# Patient Record
Sex: Female | Born: 1945 | Race: Black or African American | Hispanic: No | Smoking: Former smoker
Health system: Southern US, Community
[De-identification: ages and names within clinical notes are randomized; demographics above are authoritative.]

## PROBLEM LIST (undated history)

## (undated) DIAGNOSIS — E559 Vitamin D deficiency, unspecified: Secondary | ICD-10-CM

## (undated) DIAGNOSIS — E785 Hyperlipidemia, unspecified: Secondary | ICD-10-CM

## (undated) DIAGNOSIS — D649 Anemia, unspecified: Secondary | ICD-10-CM

## (undated) DIAGNOSIS — R319 Hematuria, unspecified: Secondary | ICD-10-CM

## (undated) DIAGNOSIS — K635 Polyp of colon: Secondary | ICD-10-CM

## (undated) DIAGNOSIS — I8390 Asymptomatic varicose veins of unspecified lower extremity: Secondary | ICD-10-CM

## (undated) DIAGNOSIS — Z8719 Personal history of other diseases of the digestive system: Secondary | ICD-10-CM

## (undated) DIAGNOSIS — K5909 Other constipation: Secondary | ICD-10-CM

## (undated) HISTORY — DX: Anemia, unspecified: D64.9

## (undated) HISTORY — DX: Asymptomatic varicose veins of unspecified lower extremity: I83.90

## (undated) HISTORY — PX: BUNIONECTOMY: SHX129

## (undated) HISTORY — DX: Personal history of other diseases of the digestive system: Z87.19

## (undated) HISTORY — PX: TONSILLECTOMY: SUR1361

## (undated) HISTORY — DX: Hyperlipidemia, unspecified: E78.5

## (undated) HISTORY — DX: Vitamin D deficiency, unspecified: E55.9

## (undated) HISTORY — DX: Other constipation: K59.09

## (undated) HISTORY — PX: COLONOSCOPY: SHX174

---

## 2006-09-02 ENCOUNTER — Ambulatory Visit: Payer: Self-pay

## 2007-11-26 ENCOUNTER — Ambulatory Visit: Payer: Self-pay | Admitting: Obstetrics and Gynecology

## 2007-12-29 ENCOUNTER — Ambulatory Visit: Payer: Self-pay | Admitting: Unknown Physician Specialty

## 2008-10-14 ENCOUNTER — Ambulatory Visit: Payer: Self-pay | Admitting: Internal Medicine

## 2008-11-29 ENCOUNTER — Ambulatory Visit: Payer: Self-pay | Admitting: Obstetrics and Gynecology

## 2009-09-21 ENCOUNTER — Ambulatory Visit: Payer: Self-pay

## 2009-11-30 ENCOUNTER — Ambulatory Visit: Payer: Self-pay | Admitting: Internal Medicine

## 2010-10-30 ENCOUNTER — Ambulatory Visit: Payer: Self-pay | Admitting: Cardiology

## 2010-12-04 ENCOUNTER — Ambulatory Visit: Payer: Self-pay | Admitting: Internal Medicine

## 2011-12-05 ENCOUNTER — Ambulatory Visit: Payer: Self-pay | Admitting: Internal Medicine

## 2012-03-10 ENCOUNTER — Ambulatory Visit: Payer: Self-pay | Admitting: Internal Medicine

## 2012-12-07 ENCOUNTER — Ambulatory Visit: Payer: Self-pay | Admitting: Internal Medicine

## 2013-03-11 ENCOUNTER — Ambulatory Visit: Payer: Self-pay | Admitting: Unknown Physician Specialty

## 2013-04-30 ENCOUNTER — Ambulatory Visit: Payer: Self-pay | Admitting: Internal Medicine

## 2013-05-21 ENCOUNTER — Ambulatory Visit: Payer: Self-pay | Admitting: Urology

## 2013-09-09 ENCOUNTER — Ambulatory Visit: Payer: Self-pay | Admitting: Internal Medicine

## 2013-11-09 DIAGNOSIS — D649 Anemia, unspecified: Secondary | ICD-10-CM

## 2013-11-09 HISTORY — DX: Anemia, unspecified: D64.9

## 2014-02-03 ENCOUNTER — Ambulatory Visit: Payer: Self-pay | Admitting: Unknown Physician Specialty

## 2014-02-04 ENCOUNTER — Ambulatory Visit: Payer: Self-pay | Admitting: Internal Medicine

## 2014-11-28 ENCOUNTER — Other Ambulatory Visit: Payer: Self-pay | Admitting: Family Medicine

## 2014-11-28 DIAGNOSIS — R3129 Other microscopic hematuria: Secondary | ICD-10-CM

## 2015-03-02 ENCOUNTER — Other Ambulatory Visit: Payer: Self-pay | Admitting: Internal Medicine

## 2015-03-02 DIAGNOSIS — Z1231 Encounter for screening mammogram for malignant neoplasm of breast: Secondary | ICD-10-CM

## 2015-03-03 ENCOUNTER — Other Ambulatory Visit: Payer: Self-pay | Admitting: Internal Medicine

## 2015-03-03 ENCOUNTER — Ambulatory Visit
Admission: RE | Admit: 2015-03-03 | Discharge: 2015-03-03 | Disposition: A | Discharge status: Home / Self Care | Payer: Medicare Other | Source: Ambulatory Visit | Attending: Internal Medicine | Admitting: Internal Medicine

## 2015-03-03 DIAGNOSIS — Z1231 Encounter for screening mammogram for malignant neoplasm of breast: Secondary | ICD-10-CM

## 2015-06-26 ENCOUNTER — Other Ambulatory Visit: Payer: Self-pay | Admitting: Internal Medicine

## 2015-06-26 DIAGNOSIS — K859 Acute pancreatitis without necrosis or infection, unspecified: Secondary | ICD-10-CM

## 2015-07-03 ENCOUNTER — Ambulatory Visit
Admission: RE | Admit: 2015-07-03 | Discharge: 2015-07-03 | Disposition: A | Discharge status: Home / Self Care | Payer: Medicare Other | Source: Ambulatory Visit | Attending: Internal Medicine | Admitting: Internal Medicine

## 2015-07-03 ENCOUNTER — Ambulatory Visit: Payer: Medicare Other

## 2015-07-03 DIAGNOSIS — K7689 Other specified diseases of liver: Secondary | ICD-10-CM | POA: Diagnosis not present

## 2015-07-03 DIAGNOSIS — K859 Acute pancreatitis without necrosis or infection, unspecified: Secondary | ICD-10-CM | POA: Diagnosis not present

## 2015-12-26 ENCOUNTER — Other Ambulatory Visit: Payer: Self-pay

## 2015-12-26 DIAGNOSIS — E785 Hyperlipidemia, unspecified: Secondary | ICD-10-CM

## 2015-12-26 DIAGNOSIS — K5909 Other constipation: Secondary | ICD-10-CM

## 2015-12-26 DIAGNOSIS — E559 Vitamin D deficiency, unspecified: Secondary | ICD-10-CM | POA: Insufficient documentation

## 2015-12-26 DIAGNOSIS — I8390 Asymptomatic varicose veins of unspecified lower extremity: Secondary | ICD-10-CM

## 2015-12-26 DIAGNOSIS — Z8719 Personal history of other diseases of the digestive system: Secondary | ICD-10-CM

## 2015-12-26 HISTORY — DX: Hyperlipidemia, unspecified: E78.5

## 2015-12-26 HISTORY — DX: Vitamin D deficiency, unspecified: E55.9

## 2015-12-26 HISTORY — DX: Asymptomatic varicose veins of unspecified lower extremity: I83.90

## 2015-12-26 HISTORY — DX: Other constipation: K59.09

## 2015-12-26 HISTORY — DX: Personal history of other diseases of the digestive system: Z87.19

## 2015-12-27 ENCOUNTER — Encounter: Payer: Self-pay | Admitting: Gastroenterology

## 2015-12-27 ENCOUNTER — Ambulatory Visit (INDEPENDENT_AMBULATORY_CARE_PROVIDER_SITE_OTHER): Payer: Medicare Other | Admitting: Gastroenterology

## 2015-12-27 VITALS — BP 117/63 | HR 70 | Temp 98.2°F | Ht 67.0 in | Wt 150.0 lb

## 2015-12-27 DIAGNOSIS — K859 Acute pancreatitis without necrosis or infection, unspecified: Secondary | ICD-10-CM | POA: Diagnosis not present

## 2015-12-27 NOTE — Progress Notes (Signed)
Gastroenterology Consultation  Referring Provider:     Marguarite Arbour, MD Primary Care Physician:  Marguarite Arbour, MD Primary Gastroenterologist:  Dr. Servando Snare     Reason for Consultation:     Elevated lipase        HPI:   Jamie Chaney is a 70 y.o. y/o female referred for consultation & management of Elevated lipase by Dr. Marguarite Arbour, MD.  This patient comes in today for a isolated elevated lipase. The patient has been seen in the past by Dr. Earnest Conroy nurse practitioner with a workup that included imaging of the abdomen with no signs of gallstones biliary ductal dilatation or stones. There were several hepatic cysts that were stable and nonobstructive calculi in the kidney. The patient denies any alcohol abuse. She denies any abdominal pain. The patient does report that she has back pain at times. There is no report of any prescription medications although the patient does take multiple herbal medications that she states is for her cholesterol. She denies any unexplained weight loss fevers chills nausea or vomiting. The patient also denies any black stools or bloody stools.  Past Medical History  Diagnosis Date  . 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 Avitaminosis D 12/26/2015  . Chronic anemia 11/09/2013  . HLD (hyperlipidemia) 12/26/2015  . Chronic constipation 12/26/2015  . History of pancreatitis 12/26/2015  . Vitamin D deficiency 12/26/2015    Past Surgical History  Procedure Laterality Date  . Cesarean section      x2  . Colonoscopy      2009, 2014  . Bunionectomy    . Tonsillectomy      Prior to Admission medications   Medication Sig Start Date End Date Taking? Authorizing Provider  aspirin EC 81 MG tablet Take by mouth.   Yes Historical Provider, MD  BIOTIN PO Take by mouth.   Yes Historical Provider, MD  calcium carbonate (OS-CAL - DOSED IN MG OF ELEMENTAL CALCIUM) 1250 (500 Ca) MG tablet Take by mouth.   Yes Historical Provider, MD  CHIA SEED PO Take by mouth.   Yes  Historical Provider, MD  Cholecalciferol (VITAMIN D3) 2000 units capsule Take by mouth.   Yes Historical Provider, MD  Flaxseed, Linseed, (FLAX SEED OIL PO) Take by mouth.   Yes Historical Provider, MD  Multiple Vitamin (MULTI-VITAMINS) TABS Take by mouth.   Yes Historical Provider, MD  Omega-3 Fatty Acids (FISH OIL) 1000 MG CAPS Take by mouth.   Yes Historical Provider, MD  Red Yeast Rice Extract (RED YEAST RICE PO) Take by mouth.   Yes Historical Provider, MD    Family History  Problem Relation Age of Onset  . Lung cancer Mother   . Diabetes Mother   . Hypertension Mother   . Kidney disease Father   . Heart failure Father   . Hypertension Father   . Colon polyps Sister   . Breast cancer Paternal Aunt      Social History  Substance Use Topics  . Smoking status: Former Games developer  . Smokeless tobacco: Never Used  . Alcohol Use: 0.0 oz/week    0 Standard drinks or equivalent per week    Allergies as of 12/27/2015  . (No Known Allergies)    Review of Systems:    All systems reviewed and negative except where noted in HPI.   Physical Exam:  BP 117/63 mmHg  Pulse 70  Temp(Src) 98.2 F (36.8 C) (Oral)  Ht  (1.702 m)  Wt 150 lb (68.04  kg)  BMI 23.49 kg/m2 No LMP recorded. Patient is postmenopausal. Psych:  Alert and cooperative. Normal mood and affect. General:   Alert,  Well-developed, well-nourished, pleasant and cooperative in NAD Head:  Normocephalic and atraumatic. Eyes:  Sclera clear, no icterus.   Conjunctiva pink. Ears:  Normal auditory acuity. Nose:  No deformity, discharge, or lesions. Mouth:  No deformity or lesions,oropharynx pink & moist. Neck:  Supple; no masses or thyromegaly. Lungs:  Respirations even and unlabored.  Clear throughout to auscultation.   No wheezes, crackles, or rhonchi. No acute distress. Heart:  Regular rate and rhythm; no murmurs, clicks, rubs, or gallops. Abdomen:  Normal bowel sounds.  No bruits.  Soft, non-tender and non-distended  without masses, hepatosplenomegaly or hernias noted.  No guarding or rebound tenderness.  Negative Carnett sign.   Rectal:  Deferred.  Msk:  Symmetrical without gross deformities.  Good, equal movement & strength bilaterally. Pulses:  Normal pulses noted. Extremities:  No clubbing or edema.  No cyanosis. Neurologic:  Alert and oriented x3;  grossly normal neurologically. Skin:  Intact without significant lesions or rashes.  No jaundice. Lymph Nodes:  No significant cervical adenopathy. Psych:  Alert and cooperative. Normal mood and affect.  Imaging Studies: No results found.  Assessment and Plan:   Jamie Chaney is a 70 y.o. y/o female who comes in today with abnormal lipase. The patient has had this chronically over the last few years. The patient's workup to date has been negative review the patient will have her amylase and lipase checked today in addition to setting off her IgG4 for possible autoimmune pancreatitis. The patient has been told that if these come back as continued elevated enzymes that she should stay off of all of her herbal medication for 2 weeks and have it repeated at that time. If they come back to normal at is likely something that she is taking over-the-counter. If they remain high then the patient has been told that a endoscopic ultrasound would be recommended. The patient has been explained the plan and agrees with it.   Note: This dictation was prepared with Dragon dictation along with smaller phrase technology. Any transcriptional errors that result from this process are unintentional.

## 2015-12-29 LAB — AMYLASE: Amylase: 177 U/L — ABNORMAL HIGH (ref 31–124)

## 2015-12-29 LAB — IGG 4: IGG 4: 17 mg/dL (ref 2–96)

## 2015-12-29 LAB — LIPASE: Lipase: 32 U/L (ref 0–59)

## 2016-01-02 ENCOUNTER — Telehealth: Payer: Self-pay

## 2016-01-02 NOTE — Telephone Encounter (Signed)
LVM for pt to return my call.

## 2016-01-02 NOTE — Telephone Encounter (Signed)
-----   Message from Midge Minium, MD sent at 01/01/2016 12:27 PM EDT ----- Let the patient know that the tests for autoimmune pancreatitis was negative and her other pancreatic enzyme note his lipase was normal. Since she has is chronic elevation of amylase the patient should be sent off for an EUS.

## 2016-01-02 NOTE — Telephone Encounter (Signed)
Pt notified of lab results and Dr. Annabell Sabal recommendation for her to have an EUS. Pt will think about it and discuss with family and contact me if she decides to move forward.

## 2016-01-03 ENCOUNTER — Other Ambulatory Visit: Payer: Self-pay

## 2016-01-03 ENCOUNTER — Telehealth: Payer: Self-pay

## 2016-01-03 DIAGNOSIS — K859 Acute pancreatitis, unspecified: Secondary | ICD-10-CM

## 2016-01-03 NOTE — Telephone Encounter (Signed)
Patient has questions pertaining to her blood test results. She doesn't know whether she soul make an appointment or if they can be answered over the phone. Please call back at [phone]

## 2016-01-04 ENCOUNTER — Other Ambulatory Visit: Payer: Self-pay

## 2016-01-04 DIAGNOSIS — K859 Acute pancreatitis, unspecified: Secondary | ICD-10-CM

## 2016-01-04 NOTE — Telephone Encounter (Signed)
Discussed results again with the pt. Answered all questions. Pt will wait to repeat labs next week to decide if she would like to move forward with EUS as recommended by Dr. Servando Snare. Lab order has been entered for pt to go next week.

## 2016-01-11 LAB — IGG 4: IGG 4: 18 mg/dL (ref 2–96)

## 2016-01-11 LAB — LIPASE: Lipase: 47 U/L (ref 0–59)

## 2016-01-11 LAB — AMYLASE: Amylase: 179 U/L — ABNORMAL HIGH (ref 31–124)

## 2016-01-31 ENCOUNTER — Telehealth: Payer: Self-pay

## 2016-01-31 NOTE — Telephone Encounter (Signed)
Pt was advised of results. She is trying to decide if she would like to go through with the EUS. She will call me when she is ready.

## 2016-01-31 NOTE — Telephone Encounter (Signed)
-----   Message from Midge Miniumarren Wohl, MD sent at 01/31/2016  7:45 AM EDT ----- Let the patient know that despite being off of the herbal medication her lipase is still elevated and she should be set up for an EUS to look at the pancreas more closely.

## 2016-03-26 ENCOUNTER — Other Ambulatory Visit: Payer: Self-pay | Admitting: Internal Medicine

## 2016-03-26 DIAGNOSIS — Z1231 Encounter for screening mammogram for malignant neoplasm of breast: Secondary | ICD-10-CM

## 2016-04-24 ENCOUNTER — Ambulatory Visit
Admission: RE | Admit: 2016-04-24 | Discharge: 2016-04-24 | Disposition: A | Discharge status: Home / Self Care | Payer: Medicare Other | Source: Ambulatory Visit | Attending: Internal Medicine | Admitting: Internal Medicine

## 2016-04-24 DIAGNOSIS — Z1231 Encounter for screening mammogram for malignant neoplasm of breast: Secondary | ICD-10-CM | POA: Diagnosis present

## 2016-12-16 DIAGNOSIS — M810 Age-related osteoporosis without current pathological fracture: Secondary | ICD-10-CM | POA: Insufficient documentation

## 2017-05-06 ENCOUNTER — Other Ambulatory Visit: Payer: Self-pay | Admitting: Internal Medicine

## 2017-05-06 DIAGNOSIS — Z1231 Encounter for screening mammogram for malignant neoplasm of breast: Secondary | ICD-10-CM

## 2017-05-27 ENCOUNTER — Inpatient Hospital Stay: Admission: RE | Admit: 2017-05-27 | Payer: Medicare Other | Source: Ambulatory Visit

## 2017-08-21 ENCOUNTER — Ambulatory Visit
Admission: RE | Admit: 2017-08-21 | Discharge: 2017-08-21 | Disposition: A | Discharge status: Home / Self Care | Payer: Medicare HMO | Source: Ambulatory Visit | Attending: Internal Medicine | Admitting: Internal Medicine

## 2017-08-21 DIAGNOSIS — Z1231 Encounter for screening mammogram for malignant neoplasm of breast: Secondary | ICD-10-CM | POA: Insufficient documentation

## 2017-10-01 ENCOUNTER — Other Ambulatory Visit: Payer: Self-pay | Admitting: Family Medicine

## 2017-10-01 ENCOUNTER — Ambulatory Visit
Admission: RE | Admit: 2017-10-01 | Discharge: 2017-10-01 | Disposition: A | Discharge status: Home / Self Care | Payer: Medicare HMO | Source: Ambulatory Visit | Attending: Family Medicine | Admitting: Family Medicine

## 2017-10-01 DIAGNOSIS — N200 Calculus of kidney: Secondary | ICD-10-CM | POA: Diagnosis not present

## 2017-10-01 DIAGNOSIS — R1013 Epigastric pain: Secondary | ICD-10-CM | POA: Diagnosis not present

## 2017-10-01 DIAGNOSIS — Q625 Duplication of ureter: Secondary | ICD-10-CM | POA: Diagnosis not present

## 2017-10-01 MED ORDER — IOPAMIDOL (ISOVUE-300) INJECTION 61%
100.0000 mL | Freq: Once | INTRAVENOUS | Status: AC | PRN
  Administered 2017-10-01: 100 mL via INTRAVENOUS

## 2018-01-30 DIAGNOSIS — Z8371 Family history of colonic polyps: Secondary | ICD-10-CM | POA: Insufficient documentation

## 2018-05-05 ENCOUNTER — Encounter: Payer: Self-pay | Admitting: *Deleted

## 2018-05-06 ENCOUNTER — Encounter: Payer: Self-pay | Admitting: Emergency Medicine

## 2018-05-06 ENCOUNTER — Ambulatory Visit: Payer: Medicare HMO | Admitting: Anesthesiology

## 2018-05-06 ENCOUNTER — Encounter
Admission: RE | Disposition: A | Discharge status: Home / Self Care | Payer: Self-pay | Source: Ambulatory Visit | Attending: Unknown Physician Specialty

## 2018-05-06 ENCOUNTER — Ambulatory Visit
Admission: RE | Admit: 2018-05-06 | Discharge: 2018-05-06 | Disposition: A | Discharge status: Home / Self Care | Payer: Medicare HMO | Source: Ambulatory Visit | Attending: Unknown Physician Specialty | Admitting: Unknown Physician Specialty

## 2018-05-06 DIAGNOSIS — Z1211 Encounter for screening for malignant neoplasm of colon: Secondary | ICD-10-CM | POA: Insufficient documentation

## 2018-05-06 DIAGNOSIS — K648 Other hemorrhoids: Secondary | ICD-10-CM | POA: Diagnosis not present

## 2018-05-06 DIAGNOSIS — Z79899 Other long term (current) drug therapy: Secondary | ICD-10-CM | POA: Insufficient documentation

## 2018-05-06 DIAGNOSIS — Z87891 Personal history of nicotine dependence: Secondary | ICD-10-CM | POA: Insufficient documentation

## 2018-05-06 DIAGNOSIS — Z8371 Family history of colonic polyps: Secondary | ICD-10-CM | POA: Diagnosis not present

## 2018-05-06 HISTORY — DX: Hematuria, unspecified: R31.9

## 2018-05-06 HISTORY — PX: COLONOSCOPY WITH PROPOFOL: SHX5780

## 2018-05-06 HISTORY — DX: Polyp of colon: K63.5

## 2018-05-06 SURGERY — COLONOSCOPY WITH PROPOFOL
Anesthesia: General

## 2018-05-06 MED ORDER — SODIUM CHLORIDE 0.9 % IV SOLN
INTRAVENOUS | Status: DC
  Administered 2018-05-06: 1000 mL via INTRAVENOUS

## 2018-05-06 MED ORDER — PROPOFOL 500 MG/50ML IV EMUL
INTRAVENOUS | Status: DC | PRN
  Administered 2018-05-06: 130 ug/kg/min via INTRAVENOUS

## 2018-05-06 MED ORDER — PROPOFOL 500 MG/50ML IV EMUL
INTRAVENOUS | Status: AC
  Filled 2018-05-06: qty 50

## 2018-05-06 MED ORDER — SODIUM CHLORIDE 0.9 % IV SOLN: INTRAVENOUS | Status: DC

## 2018-05-06 MED ORDER — LIDOCAINE HCL (CARDIAC) PF 100 MG/5ML IV SOSY
PREFILLED_SYRINGE | INTRAVENOUS | Status: DC | PRN
  Administered 2018-05-06: 50 mg via INTRAVENOUS

## 2018-05-06 MED ORDER — PROPOFOL 10 MG/ML IV BOLUS
INTRAVENOUS | Status: DC | PRN
  Administered 2018-05-06: 30 mg via INTRAVENOUS
  Administered 2018-05-06: 60 mg via INTRAVENOUS
  Administered 2018-05-06: 10 mg via INTRAVENOUS

## 2018-05-06 MED ORDER — LIDOCAINE HCL (PF) 2 % IJ SOLN
INTRAMUSCULAR | Status: AC
  Filled 2018-05-06: qty 10

## 2018-05-06 NOTE — Anesthesia Postprocedure Evaluation (Signed)
Anesthesia Post Note  Patient: Jamie AlbinoLinda L Berish  Procedure(s) Performed: COLONOSCOPY WITH PROPOFOL (N/A )  Patient location during evaluation: PACU Anesthesia Type: General Level of consciousness: awake and alert and oriented Pain management: pain level controlled Vital Signs Assessment: post-procedure vital signs reviewed and stable Respiratory status: spontaneous breathing Cardiovascular status: blood pressure returned to baseline Anesthetic complications: no     Last Vitals:  Vitals:   05/06/18 0937 05/06/18 0947  BP: 105/61 (!) 97/55  Pulse: 67 67  Resp: (!) 21 18  Temp:    SpO2: 99% 100%    Last Pain:  Vitals:   05/06/18 0947  TempSrc:   PainSc: 0-No pain                 Vannesa Abair

## 2018-05-06 NOTE — Transfer of Care (Signed)
Immediate Anesthesia Transfer of Care Note  Patient: Jamie AlbinoLinda L Simons  Procedure(s) Performed: COLONOSCOPY WITH PROPOFOL (N/A )  Patient Location: PACU  Anesthesia Type:General  Level of Consciousness: drowsy and responds to stimulation  Airway & Oxygen Therapy: Patient Spontanous Breathing and Patient connected to nasal cannula oxygen  Post-op Assessment: Report given to RN and Post -op Vital signs reviewed and stable  Post vital signs: Reviewed and stable  Last Vitals:  Vitals Value Taken Time  BP 91/60 05/06/2018  9:18 AM  Temp    Pulse 94 05/06/2018  9:18 AM  Resp 16 05/06/2018  9:18 AM  SpO2 99 % 05/06/2018  9:18 AM  Vitals shown include unvalidated device data.  Last Pain:  Vitals:   05/06/18 0759  TempSrc: Tympanic  PainSc: 0-No pain         Complications: No apparent anesthesia complications

## 2018-05-06 NOTE — Anesthesia Preprocedure Evaluation (Signed)
Anesthesia Evaluation  Patient identified by MRN, date of birth, ID band Patient awake    Reviewed: Allergy & Precautions, NPO status , Patient's Chart, lab work & pertinent test results  Airway Mallampati: II  TM Distance: >3 FB     Dental   Pulmonary former smoker,    Pulmonary exam normal        Cardiovascular negative cardio ROS Normal cardiovascular exam     Neuro/Psych negative neurological ROS  negative psych ROS   GI/Hepatic Neg liver ROS, Hx of colon polyps   Endo/Other  negative endocrine ROS  Renal/GU negative Renal ROS  negative genitourinary   Musculoskeletal negative musculoskeletal ROS (+)   Abdominal Normal abdominal exam  (+)   Peds negative pediatric ROS (+)  Hematology  (+) anemia ,   Anesthesia Other Findings Past Medical History: 12/26/2015: Avitaminosis D 11/09/2013: Chronic anemia 12/26/2015: Chronic constipation No date: Colon polyps No date: Hematuria 12/26/2015: History of pancreatitis 12/26/2015: HLD (hyperlipidemia) 12/26/2015: Leg varices 12/26/2015: Vitamin D deficiency  Reproductive/Obstetrics                             Anesthesia Physical Anesthesia Plan  ASA: II  Anesthesia Plan: General   Post-op Pain Management:    Induction: Intravenous  PONV Risk Score and Plan: Propofol infusion  Airway Management Planned: Nasal Cannula  Additional Equipment:   Intra-op Plan:   Post-operative Plan:   Informed Consent: I have reviewed the patients History and Physical, chart, labs and discussed the procedure including the risks, benefits and alternatives for the proposed anesthesia with the patient or authorized representative who has indicated his/her understanding and acceptance.   Dental advisory given  Plan Discussed with: CRNA and Surgeon  Anesthesia Plan Comments:         Anesthesia Quick Evaluation

## 2018-05-06 NOTE — Op Note (Signed)
[facility] Gastroenterology Patient Name: Jamie Chaney Procedure Date: 05/06/2018 8:42 AM MRN: [id number] Account #: [id number] Date of Birth: 03-14-1946 Admit Type: Outpatient Age: 72 Room: [facility] ENDO ROOM 3 Gender: Female Note Status: Finalized Procedure:            Colonoscopy Indications:          Colon cancer screening in patient at increased risk:                        Family history of 1st-degree relative with colon polyps Providers:            Scot Jun, MD Referring MD:         Duane Lope. Judithann Sheen, MD (Referring MD) Medicines:            Propofol per Anesthesia Complications:        No immediate complications. Procedure:            Pre-Anesthesia Assessment:                       - After reviewing the risks and benefits, the patient                        was deemed in satisfactory condition to undergo the                        procedure.                       After obtaining informed consent, the colonoscope was                        passed under direct vision. Throughout the procedure,                        the patient's blood pressure, pulse, and oxygen                        saturations were monitored continuously. The                        Colonoscope was introduced through the anus and                        advanced to the the cecum, identified by appendiceal                        orifice and ileocecal valve. The colonoscopy was                        performed without difficulty. The patient tolerated the                        procedure well. The quality of the bowel preparation                        was excellent. Findings:      Internal hemorrhoids were found during endoscopy. The hemorrhoids were       small and Grade I (internal hemorrhoids that do not prolapse).      The exam was otherwise without abnormality. Prep excellent. Impression:           -  Internal hemorrhoids.                       - The examination was otherwise  normal.                       - No specimens collected. Recommendation:       - Repeat colonoscopy in 5 years for screening purposes. Scot Junobert T Jaeshaun Riva, MD 05/06/2018 9:18:03 AM This report has been signed electronically. Number of Addenda: 0 Note Initiated On: 05/06/2018 8:42 AM Scope Withdrawal Time: 0 hours 9 minutes 2 seconds  Total Procedure Duration: 0 hours 17 minutes 38 seconds       [facility]

## 2018-05-06 NOTE — Anesthesia Post-op Follow-up Note (Signed)
Anesthesia QCDR form completed.        

## 2018-05-06 NOTE — H&P (Signed)
Primary Care Physician:  Marguarite Arbour, MD Primary Gastroenterologist:  Dr. Mechele Collin  Pre-Procedure History & Physical: HPI:  Jamie Chaney is a 72 y.o. female is here for an colonoscopy.   Past Medical History:  Diagnosis Date  . Avitaminosis D 12/26/2015  . Chronic anemia 11/09/2013  . Chronic constipation 12/26/2015  . Colon polyps   . Hematuria   . History of pancreatitis 12/26/2015  . HLD (hyperlipidemia) 12/26/2015  . 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 Vitamin D deficiency 12/26/2015    Past Surgical History:  Procedure Laterality Date  . BUNIONECTOMY    . CESAREAN SECTION     x2  . COLONOSCOPY     2009, 2014  . TONSILLECTOMY      Prior to Admission medications   Medication Sig Start Date End Date Taking? Authorizing Provider  aspirin EC 81 MG tablet Take by mouth.   Yes [provider]  BIOTIN PO Take by mouth.   Yes [provider]  calcium carbonate (OS-CAL - DOSED IN MG OF ELEMENTAL CALCIUM) 1250 (500 Ca) MG tablet Take by mouth.   Yes [provider]  CHIA SEED PO Take by mouth.   Yes [provider]  Cholecalciferol (VITAMIN D3) 2000 units capsule Take by mouth.   Yes [provider]  Flaxseed, Linseed, (FLAX SEED OIL PO) Take by mouth.   Yes [provider]  fluticasone (FLONASE) 50 MCG/ACT nasal spray Place 2 sprays into both nostrils daily.   Yes [provider]  Multiple Vitamin (MULTI-VITAMINS) TABS Take by mouth.   Yes [provider]  niacin 500 MG tablet Take 500 mg by mouth daily with breakfast.   Yes [provider]  Omega-3 Fatty Acids (FISH OIL) 1000 MG CAPS Take by mouth.   Yes [provider]  raloxifene (EVISTA) 60 MG tablet Take 60 mg by mouth daily.   Yes [provider]  Red Yeast Rice Extract (RED YEAST RICE PO) Take by mouth.   Yes [provider]    Allergies as of 03/06/2018  . (No Known Allergies)    Family History  Problem Relation  Age of Onset  . Lung cancer Mother   . Diabetes Mother   . Hypertension Mother   . Kidney disease Father   . Heart failure Father   . Hypertension Father   . Colon polyps Sister   . Breast cancer Paternal Aunt     Social History   Socioeconomic History  . Marital status: Divorced    Spouse name: Not on file  . Number of children: Not on file  . Years of education: Not on file  . Highest education level: Not on file  Occupational History  . Not on file  Social Needs  . Financial resource strain: Not hard at all  . Food insecurity:    Worry: Never true    Inability: Never true  . Transportation needs:    Medical: No    Non-medical: No  Tobacco Use  . Smoking status: Former Smoker    Last attempt to quit: 12/07/1974    Years since quitting: 43.4  . Smokeless tobacco: Former Engineer, water and Sexual Activity  . Alcohol use: Yes    Alcohol/week: 0.0 standard drinks  . Drug use: No  . Sexual activity: Not on file  Lifestyle  . Physical activity:    Days per week: Patient refused    Minutes per session: Patient refused  . Stress: Not at  all  Relationships  . Social connections:    Talks on phone: Patient refused    Gets together: Patient refused    Attends religious service: Patient refused    Active member of club or organization: Patient refused    Attends meetings of clubs or organizations: Patient refused    Relationship status: Patient refused  . Intimate partner violence:    Fear of current or ex partner: Patient refused    Emotionally abused: Patient refused    Physically abused: Patient refused    Forced sexual activity: Patient refused  Other Topics Concern  . Not on file  Social History Narrative  . Not on file    Review of Systems: See HPI, otherwise negative ROS  Physical Exam: BP 119/66   Pulse 75   Temp (!) 95.6 F (35.3 C) (Tympanic)   Ht 5\' 7"  (1.702 m)   Wt 72.6 kg   SpO2 100%   BMI 25.06 kg/m  General:   Alert,  pleasant and  cooperative in NAD Head:  Normocephalic and atraumatic. Neck:  Supple; no masses or thyromegaly. Lungs:  Clear throughout to auscultation.    Heart:  Regular rate and rhythm. Abdomen:  Soft, nontender and nondistended. Normal bowel sounds, without guarding, and without rebound.   Neurologic:  Alert and  oriented x4;  grossly normal neurologically.  Impression/Plan: Jamie Chaney is here for an colonoscopy to be performed for FH colon polyps.  Risks, benefits, limitations, and alternatives regarding  colonoscopy have been reviewed with the patient.  Questions have been answered.  All parties agreeable.   Lynnae PrudeELLIOTT, ROBERT, MD  05/06/2018, 8:48 AM

## 2018-05-06 NOTE — Anesthesia Procedure Notes (Signed)
Performed by: Kinslie Hove, CRNA Pre-anesthesia Checklist: Patient identified, Emergency Drugs available, Suction available, Patient being monitored and Timeout performed Patient Re-evaluated:Patient Re-evaluated prior to induction Oxygen Delivery Method: Nasal cannula Induction Type: IV induction       

## 2018-05-11 ENCOUNTER — Encounter: Payer: Self-pay | Admitting: Unknown Physician Specialty

## 2018-08-07 ENCOUNTER — Other Ambulatory Visit: Payer: Self-pay | Admitting: Orthopedic Surgery

## 2018-08-07 DIAGNOSIS — M75101 Unspecified rotator cuff tear or rupture of right shoulder, not specified as traumatic: Secondary | ICD-10-CM

## 2018-08-14 ENCOUNTER — Ambulatory Visit
Admission: RE | Admit: 2018-08-14 | Discharge: 2018-08-14 | Disposition: A | Discharge status: Home / Self Care | Payer: Medicare HMO | Source: Ambulatory Visit | Attending: Orthopedic Surgery | Admitting: Orthopedic Surgery

## 2018-08-14 DIAGNOSIS — M75101 Unspecified rotator cuff tear or rupture of right shoulder, not specified as traumatic: Secondary | ICD-10-CM | POA: Diagnosis not present

## 2018-08-31 DIAGNOSIS — M75111 Incomplete rotator cuff tear or rupture of right shoulder, not specified as traumatic: Secondary | ICD-10-CM | POA: Insufficient documentation

## 2018-08-31 DIAGNOSIS — M7581 Other shoulder lesions, right shoulder: Secondary | ICD-10-CM | POA: Insufficient documentation

## 2018-09-28 ENCOUNTER — Other Ambulatory Visit: Payer: Self-pay | Admitting: Internal Medicine

## 2018-09-28 DIAGNOSIS — Z1231 Encounter for screening mammogram for malignant neoplasm of breast: Secondary | ICD-10-CM

## 2018-10-19 DIAGNOSIS — S43431A Superior glenoid labrum lesion of right shoulder, initial encounter: Secondary | ICD-10-CM | POA: Insufficient documentation

## 2018-11-18 ENCOUNTER — Other Ambulatory Visit: Payer: Self-pay

## 2018-11-18 ENCOUNTER — Ambulatory Visit
Admission: RE | Admit: 2018-11-18 | Discharge: 2018-11-18 | Disposition: A | Discharge status: Home / Self Care | Payer: Medicare HMO | Source: Ambulatory Visit | Attending: Internal Medicine | Admitting: Internal Medicine

## 2018-11-18 DIAGNOSIS — Z1231 Encounter for screening mammogram for malignant neoplasm of breast: Secondary | ICD-10-CM | POA: Diagnosis present

## 2018-12-08 ENCOUNTER — Other Ambulatory Visit: Payer: Self-pay | Admitting: Internal Medicine

## 2018-12-08 DIAGNOSIS — R1084 Generalized abdominal pain: Secondary | ICD-10-CM

## 2018-12-15 ENCOUNTER — Other Ambulatory Visit: Payer: Self-pay

## 2018-12-15 ENCOUNTER — Ambulatory Visit
Admission: RE | Admit: 2018-12-15 | Discharge: 2018-12-15 | Disposition: A | Discharge status: Home / Self Care | Payer: Medicare HMO | Source: Ambulatory Visit | Attending: Internal Medicine | Admitting: Internal Medicine

## 2018-12-15 DIAGNOSIS — R1084 Generalized abdominal pain: Secondary | ICD-10-CM

## 2019-02-08 ENCOUNTER — Encounter: Payer: Self-pay | Admitting: Podiatry

## 2019-02-08 ENCOUNTER — Other Ambulatory Visit: Payer: Self-pay

## 2019-02-08 ENCOUNTER — Other Ambulatory Visit: Payer: Self-pay | Admitting: Podiatry

## 2019-02-08 ENCOUNTER — Ambulatory Visit (INDEPENDENT_AMBULATORY_CARE_PROVIDER_SITE_OTHER): Payer: Medicare HMO

## 2019-02-08 ENCOUNTER — Ambulatory Visit: Payer: Medicare HMO | Admitting: Podiatry

## 2019-02-08 DIAGNOSIS — M2042 Other hammer toe(s) (acquired), left foot: Secondary | ICD-10-CM | POA: Diagnosis not present

## 2019-02-08 DIAGNOSIS — M2011 Hallux valgus (acquired), right foot: Secondary | ICD-10-CM

## 2019-02-08 DIAGNOSIS — M2041 Other hammer toe(s) (acquired), right foot: Secondary | ICD-10-CM | POA: Diagnosis not present

## 2019-02-08 DIAGNOSIS — M67472 Ganglion, left ankle and foot: Secondary | ICD-10-CM | POA: Diagnosis not present

## 2019-02-08 DIAGNOSIS — M2012 Hallux valgus (acquired), left foot: Secondary | ICD-10-CM

## 2019-02-08 DIAGNOSIS — Q828 Other specified congenital malformations of skin: Secondary | ICD-10-CM

## 2019-02-08 NOTE — Progress Notes (Signed)
Subjective:  Patient ID: Jamie Chaney, female    DOB: May 12, 1946,  MRN: [id number] HPI Chief Complaint  Patient presents with  . Foot Pain    1st MPJ left - aching x few month, noticed a cyst has formed, no treatment  . Toe Pain    2nd toe right - callused area x few months  . Nail Problem    Check toenails-discolored areas   . New Patient (Initial Visit)    73 y.o. female presents with the above complaint.   ROS: Denies fever chills nausea vomiting muscle aches pains calf pain back pain chest pain shortness of breath.  Past Medical History:  Diagnosis Date  . Avitaminosis D 12/26/2015  . Chronic anemia 11/09/2013  . Chronic constipation 12/26/2015  . Colon polyps   . Hematuria   . History of pancreatitis 12/26/2015  . HLD (hyperlipidemia) 12/26/2015  . 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 Vitamin D deficiency 12/26/2015   Past Surgical History:  Procedure Laterality Date  . BUNIONECTOMY    . CESAREAN SECTION     x2  . COLONOSCOPY     2009, 2014  . COLONOSCOPY WITH PROPOFOL N/A 05/06/2018   Procedure: COLONOSCOPY WITH PROPOFOL;  Surgeon: Manya Silvas, MD;  Location: [facility] ENDOSCOPY;  Service: Endoscopy;  Laterality: N/A;  . TONSILLECTOMY      Current Outpatient Medications:  .  Bioflavonoid Products (BIOFLEX PO), Take by mouth., Disp: , Rfl:  .  Cholecalciferol (VITAMIN D3) 2000 units capsule, Take by mouth., Disp: , Rfl:  .  Multiple Vitamin (MULTI-VITAMINS) TABS, Take by mouth., Disp: , Rfl:  .  raloxifene (EVISTA) 60 MG tablet, Take 60 mg by mouth daily., Disp: , Rfl:  .  Red Yeast Rice Extract (RED YEAST RICE PO), Take by mouth., Disp: , Rfl:   Allergies  Allergen Reactions  . Fosamax [Alendronate]     Muscle pain    Review of Systems Objective:  There were no vitals filed for this visit.  General: Well developed, nourished, in no acute distress, alert and oriented x3   Dermatological: Skin is warm, dry and supple bilateral. Nails x 10 are well maintained; remaining  integument appears unremarkable at this time. There are no open sores, no preulcerative lesions, no rash or signs of infection present.  Vascular: Dorsalis Pedis artery and Posterior Tibial artery pedal pulses are 2/4 bilateral with immedate capillary fill time. Pedal hair growth present. No varicosities and no lower extremity edema present bilateral.   Neruologic: Grossly intact via light touch bilateral. Vibratory intact via tuning fork bilateral. Protective threshold with Semmes Wienstein monofilament intact to all pedal sites bilateral. Patellar and Achilles deep tendon reflexes 2+ bilateral. No Babinski or clonus noted bilateral.   Musculoskeletal: No gross boney pedal deformities bilateral. No pain, crepitus, or limitation noted with foot and ankle range of motion bilateral. Muscular strength 5/5 in all groups tested bilateral.  Gait: Unassisted, Nonantalgic.    Radiographs:  Hallux abductovalgus deformity was noted on left foot however she has soft tissue swelling that demonstrates more like a ganglion cyst or a large bursitis medial aspect of the hypertrophic medial condyle of the head of the first metatarsal left foot.  Assessment & Plan:   Assessment: Ganglion cyst with hallux valgus left.  Plan: Discussed etiology pathology conservative surgical therapies at this point debrided reactive hyper keratoma plantar aspect of the first metatarsal phalangeal joint and also performed a aspiration after local anesthetic was administered and the area was prepped and draped  as normal sterile fashion.  The aspiration was performed demonstrating a bloody joint fluid.     Amelie Caracci T. SebastianHyatt, [place] Dakota[place]PM

## 2020-01-07 ENCOUNTER — Other Ambulatory Visit: Payer: Self-pay | Admitting: Surgery

## 2020-01-07 DIAGNOSIS — M7582 Other shoulder lesions, left shoulder: Secondary | ICD-10-CM

## 2020-01-24 ENCOUNTER — Ambulatory Visit: Payer: Medicare HMO

## 2020-04-04 ENCOUNTER — Other Ambulatory Visit: Payer: Self-pay | Admitting: Internal Medicine

## 2020-04-04 DIAGNOSIS — Z1231 Encounter for screening mammogram for malignant neoplasm of breast: Secondary | ICD-10-CM

## 2020-05-17 ENCOUNTER — Ambulatory Visit
Admission: RE | Admit: 2020-05-17 | Discharge: 2020-05-17 | Disposition: A | Discharge status: Home / Self Care | Payer: Medicare HMO | Source: Ambulatory Visit | Attending: Internal Medicine | Admitting: Internal Medicine

## 2020-05-17 ENCOUNTER — Other Ambulatory Visit: Payer: Self-pay

## 2020-05-17 DIAGNOSIS — Z1231 Encounter for screening mammogram for malignant neoplasm of breast: Secondary | ICD-10-CM | POA: Insufficient documentation

## 2020-07-07 DIAGNOSIS — S46911A Strain of unspecified muscle, fascia and tendon at shoulder and upper arm level, right arm, initial encounter: Secondary | ICD-10-CM | POA: Insufficient documentation

## 2020-09-04 IMAGING — MR MR SHOULDER*R* W/O CM
5 series · 32 of 40 positions shown · non-contrast
Comparison: None.

CLINICAL DATA: Right shoulder pain for the past 6 weeks.

EXAM:
MRI OF THE RIGHT SHOULDER WITHOUT CONTRAST
TECHNIQUE: Multiplanar, multisequence MR imaging of the shoulder was performed.
No intravenous contrast was administered.

[Series 5: PD fat-sat · axial · right · 4.0mm · 0.55mm/px · z∈[-84,+41]mm · 8 of 28 slices shown (1 of 2)]
[im 1/28]
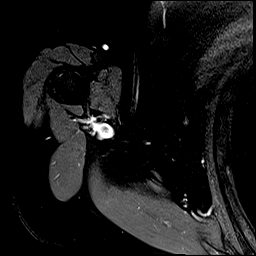
[im 4/28]
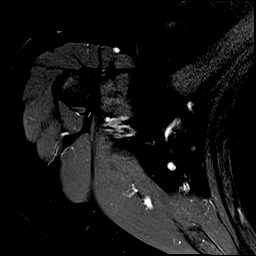
[im 10/28]
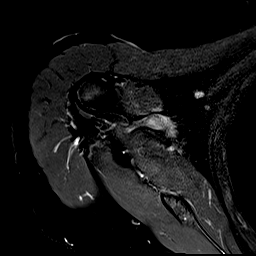
[im 13/28]
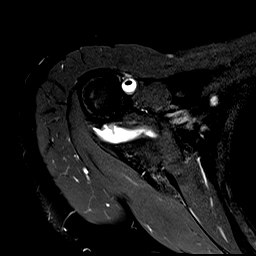
[im 16/28]
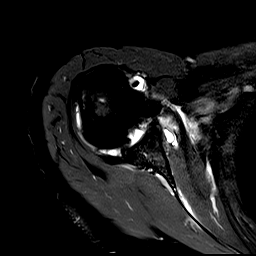
[im 19/28]
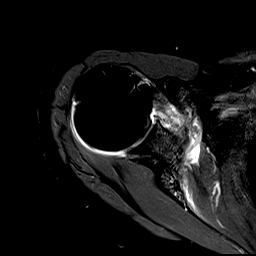
[im 25/28]
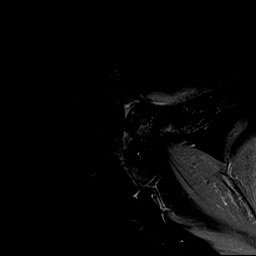
[im 28/28]
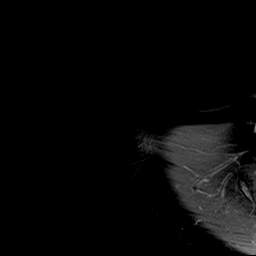

[Series 6: PD fat-sat · oblique · right · 4.0mm · 0.44mm/px · 8 of 26 slices shown (2 of 2)]
[im 1/26]
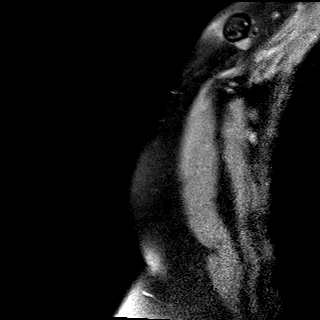
[im 4/26]
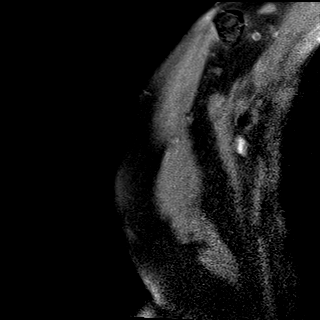
[im 8/26]
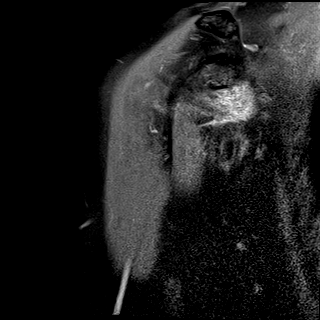
[im 11/26]
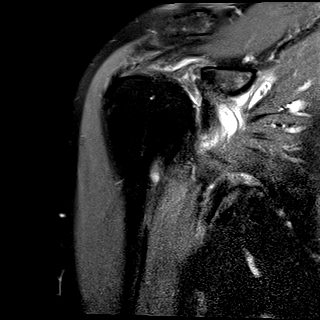
[im 15/26]
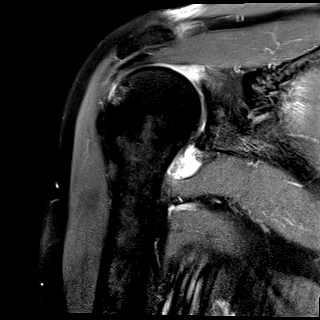
[im 18/26]
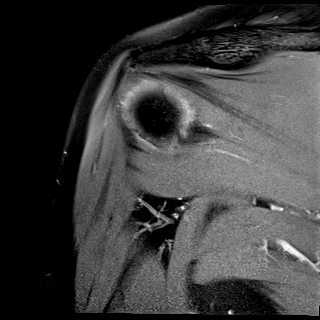
[im 22/26]
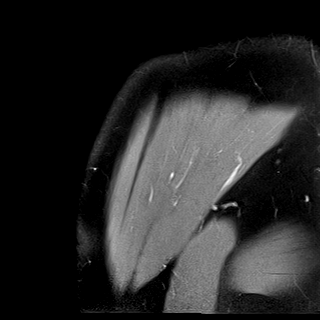
[im 26/26]
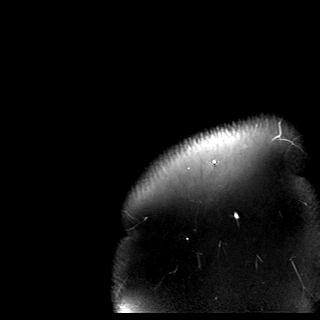

[Series 7: T2 fat-sat · oblique · right · 4.0mm · 0.44mm/px · 8 of 26 slices shown (1 of 2)]
[im 1/26]
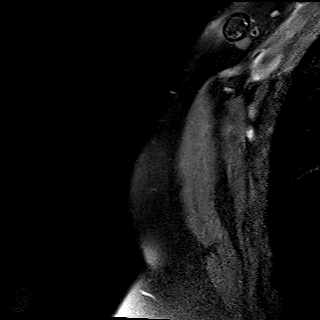
[im 4/26]
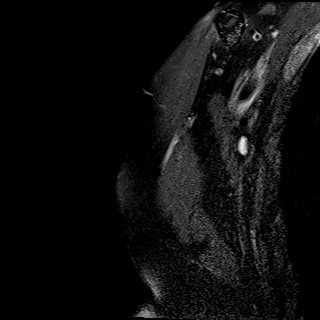
[im 8/26]
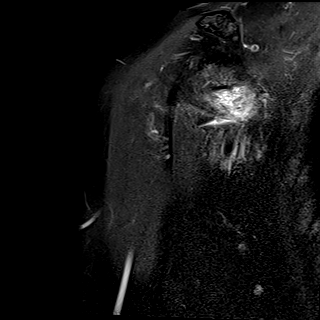
[im 11/26]
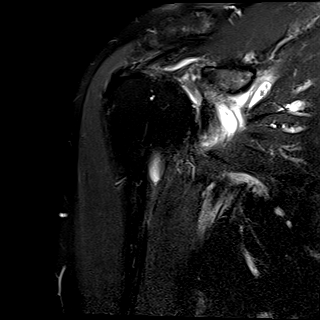
[im 15/26]
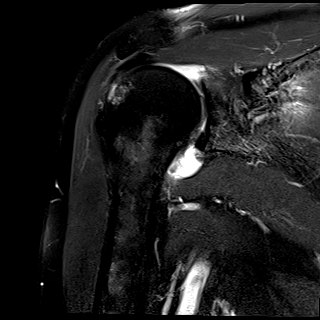
[im 18/26]
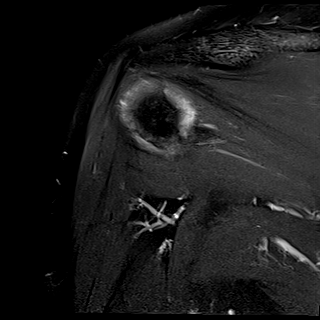
[im 22/26]
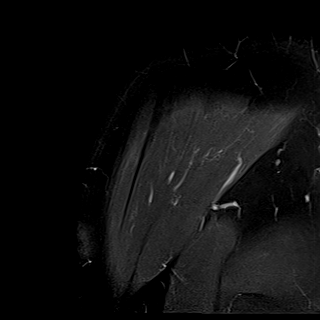
[im 26/26]
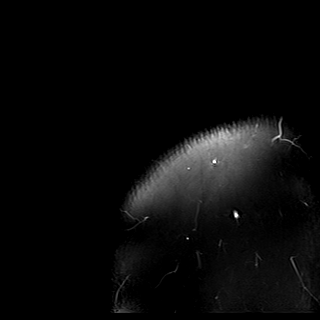

[Series 8: T2 fat-sat · oblique · right · 4.0mm · 0.23mm/px · 7 of 22 slices shown (2 of 2)]
[im 1/22]
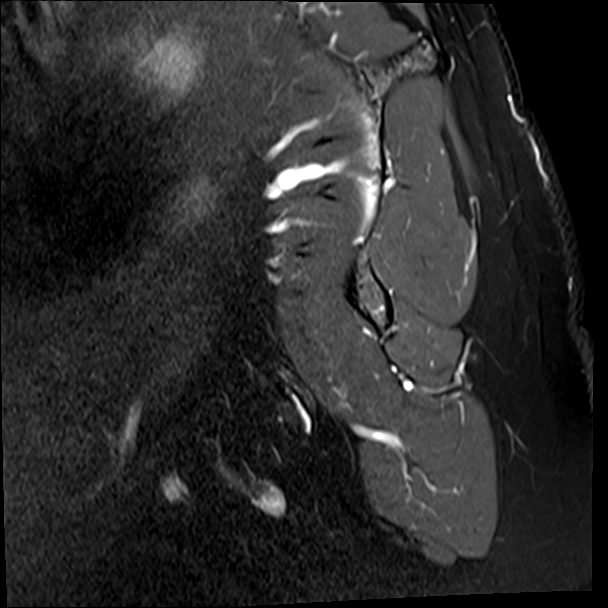
[im 4/22]
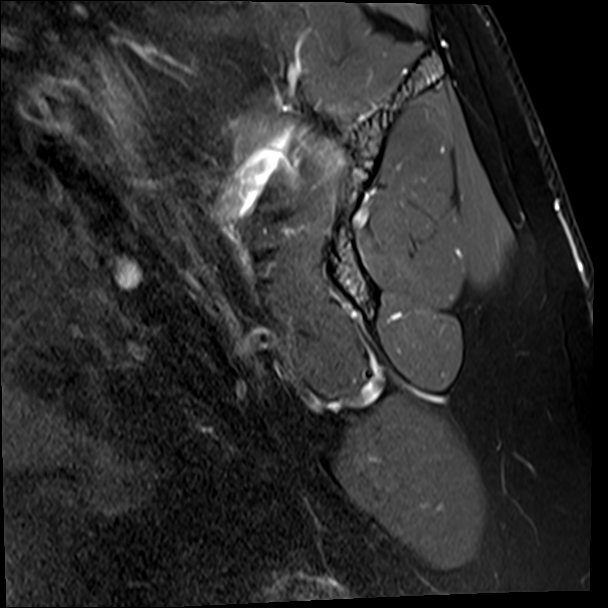
[im 8/22]
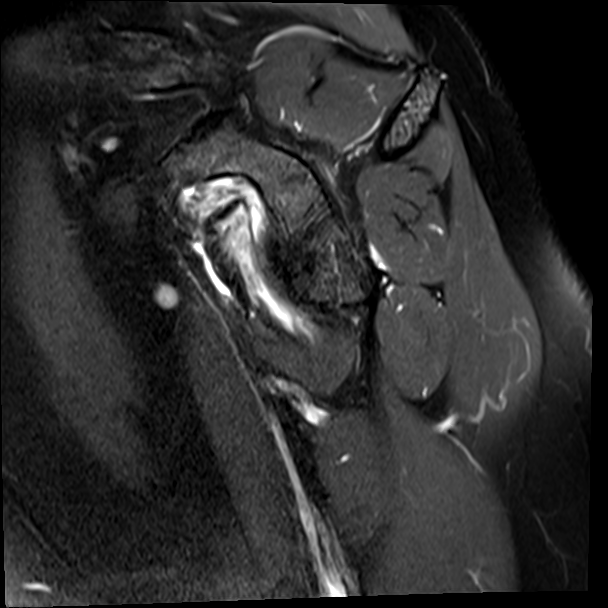
[im 11/22]
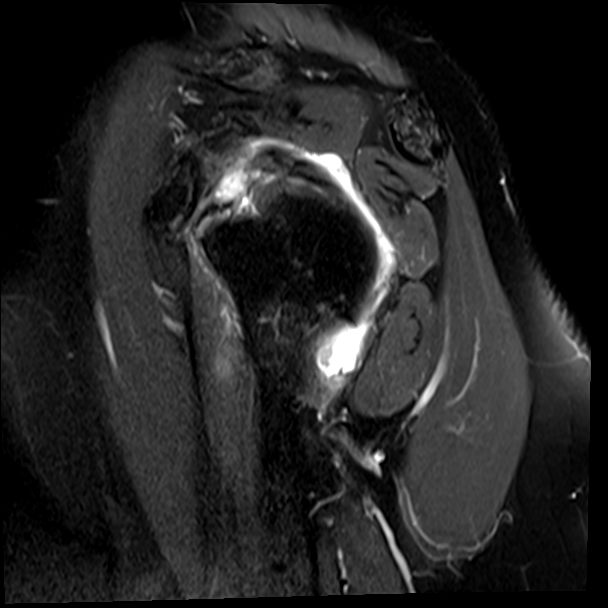
[im 15/22]
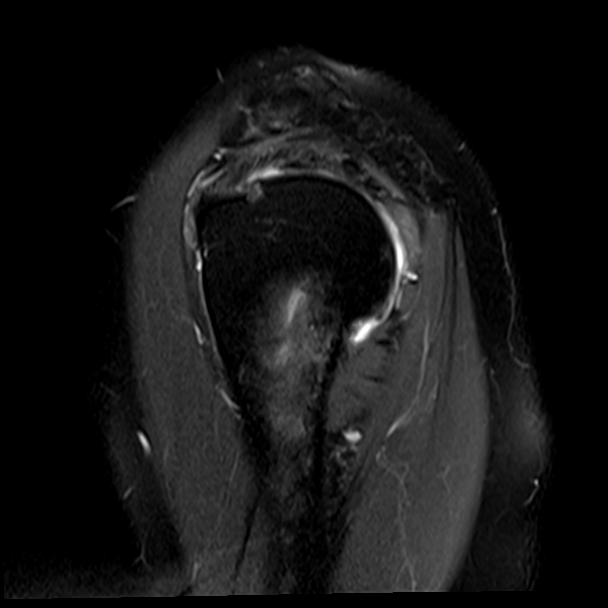
[im 18/22]
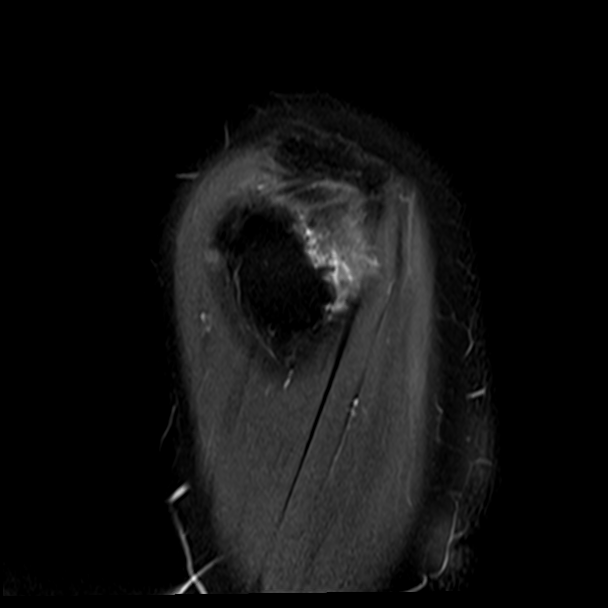
[im 22/22]
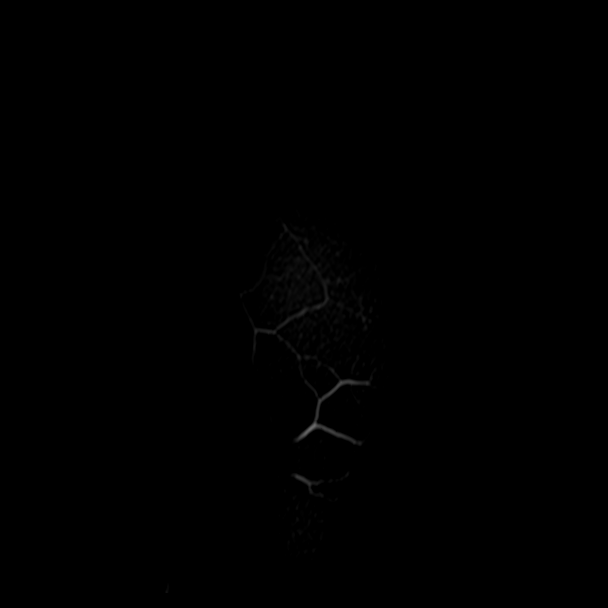

[Series 9: T1 · oblique · right · 4.0mm · 0.36mm/px · 1 of 22 slices shown]
[im 1/22]
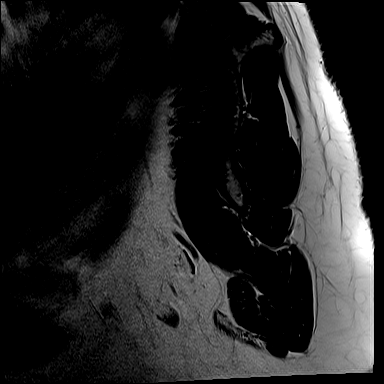

[32 of 40 positions shown; findings below may reference images not displayed]

FINDINGS: Rotator cuff: Mild supraspinatus tendinosis with partial-thickness
articular surface tear of the critical zone. Severe subscapularis
tendinosis with intrasubstance tearing. The infraspinatus and teres
minor tendons are intact.

Muscles: No atrophy or abnormal signal of the muscles of the rotator
cuff.

Biceps long head:  Intact and normally positioned.

Acromioclavicular Joint: Minimal arthropathy of the
acromioclavicular joint. Type II acromion. No subacromial/subdeltoid
bursal fluid.

Glenohumeral Joint: Small joint effusion with synovitis. Mild
partial-thickness cartilage loss.

Labrum:  Superior labral tear.

Bones:  No acute fracture or dislocation.  No focal bone lesion.

Other: None.
IMPRESSION: 1. Partial-thickness articular surface tear of the supraspinatus
critical zone.
2. Severe subscapularis tendinosis with intrasubstance tearing.
3. Superior labral tear.
4. Mild glenohumeral osteoarthritis.

## 2021-04-16 ENCOUNTER — Other Ambulatory Visit: Payer: Self-pay | Admitting: Internal Medicine

## 2021-04-16 DIAGNOSIS — Z1231 Encounter for screening mammogram for malignant neoplasm of breast: Secondary | ICD-10-CM

## 2021-05-18 ENCOUNTER — Ambulatory Visit
Admission: RE | Admit: 2021-05-18 | Discharge: 2021-05-18 | Disposition: A | Discharge status: Home / Self Care | Payer: Medicare HMO | Source: Ambulatory Visit | Attending: Internal Medicine | Admitting: Internal Medicine

## 2021-05-18 ENCOUNTER — Other Ambulatory Visit: Payer: Self-pay

## 2021-05-18 DIAGNOSIS — Z1231 Encounter for screening mammogram for malignant neoplasm of breast: Secondary | ICD-10-CM | POA: Diagnosis present

## 2021-10-18 ENCOUNTER — Other Ambulatory Visit: Payer: Self-pay | Admitting: Surgery

## 2021-10-18 DIAGNOSIS — M7582 Other shoulder lesions, left shoulder: Secondary | ICD-10-CM

## 2021-11-07 ENCOUNTER — Ambulatory Visit
Admission: RE | Admit: 2021-11-07 | Discharge: 2021-11-07 | Disposition: A | Discharge status: Home / Self Care | Payer: Medicare HMO | Source: Ambulatory Visit | Attending: Surgery | Admitting: Surgery

## 2021-11-07 DIAGNOSIS — M7582 Other shoulder lesions, left shoulder: Secondary | ICD-10-CM | POA: Insufficient documentation

## 2021-12-17 DIAGNOSIS — M75122 Complete rotator cuff tear or rupture of left shoulder, not specified as traumatic: Secondary | ICD-10-CM | POA: Insufficient documentation

## 2021-12-17 DIAGNOSIS — M12812 Other specific arthropathies, not elsewhere classified, left shoulder: Secondary | ICD-10-CM | POA: Insufficient documentation

## 2022-04-16 ENCOUNTER — Other Ambulatory Visit: Payer: Self-pay | Admitting: Internal Medicine

## 2022-04-16 DIAGNOSIS — Z1231 Encounter for screening mammogram for malignant neoplasm of breast: Secondary | ICD-10-CM

## 2022-04-24 ENCOUNTER — Ambulatory Visit (INDEPENDENT_AMBULATORY_CARE_PROVIDER_SITE_OTHER): Payer: Medicare HMO

## 2022-04-24 ENCOUNTER — Ambulatory Visit: Payer: Medicare HMO | Admitting: Podiatry

## 2022-04-24 ENCOUNTER — Encounter: Payer: Self-pay | Admitting: Podiatry

## 2022-04-24 DIAGNOSIS — M2012 Hallux valgus (acquired), left foot: Secondary | ICD-10-CM

## 2022-04-24 DIAGNOSIS — M778 Other enthesopathies, not elsewhere classified: Secondary | ICD-10-CM

## 2022-04-24 DIAGNOSIS — L600 Ingrowing nail: Secondary | ICD-10-CM

## 2022-04-24 MED ORDER — NEOMYCIN-POLYMYXIN-HC 1 % OT SOLN: OTIC | 1 refills | Status: AC

## 2022-04-24 MED ORDER — DEXAMETHASONE SODIUM PHOSPHATE 120 MG/30ML IJ SOLN
2.0000 mg | Freq: Once | INTRAMUSCULAR | Status: AC
  Administered 2022-04-24: 2 mg via INTRA_ARTICULAR

## 2022-04-24 NOTE — Progress Notes (Signed)
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 female presents with the above complaint.   ROS: Denies fever chills nausea vomiting muscle aches pains calf pain back pain chest pain shortness of breath.  Past Medical History:  Diagnosis Date   Avitaminosis D 12/26/2015   Chronic anemia 11/09/2013   Chronic constipation 12/26/2015   Colon polyps    Hematuria    History of pancreatitis 12/26/2015   HLD (hyperlipidemia) 12/26/2015   Leg varices 12/26/2015   Vitamin D deficiency 12/26/2015   Past Surgical History:  Procedure Laterality Date   BUNIONECTOMY     CESAREAN SECTION     x2   COLONOSCOPY     2009, 2014   COLONOSCOPY WITH PROPOFOL N/A 05/06/2018   Procedure: COLONOSCOPY WITH PROPOFOL;  Surgeon: Scot Jun, MD;  Location: [facility];  Service: Endoscopy;  Laterality: N/A;   TONSILLECTOMY      Current Outpatient Medications:    NEOMYCIN-POLYMYXIN-HYDROCORTISONE (CORTISPORIN) 1 % SOLN OTIC solution, Apply 1-2 drops to toe BID after soaking, Disp: 10 mL, Rfl: 1   Cholecalciferol (VITAMIN D3) 2000 units capsule, Take by mouth., Disp: , Rfl:    Multiple Vitamin (MULTI-VITAMINS) TABS, Take by mouth., Disp: , Rfl:    propranolol (INDERAL) 20 MG tablet, Take 20 mg by mouth daily., Disp: , Rfl:    raloxifene (EVISTA) 60 MG tablet, Take 60 mg by mouth daily., Disp: , Rfl:    Red Yeast Rice Extract (RED YEAST RICE PO), Take by mouth., Disp: , Rfl:   No Known Allergies Review of Systems Objective:  There were no vitals filed for this visit.  General: Well developed, nourished, in no acute distress, alert and oriented x3   Dermatological: Skin is warm, dry and supple  bilateral. Nails x 10 are well maintained; remaining integument appears unremarkable at this time. There are no open sores, no preulcerative lesions, no rash or signs of infection present.  She has sharp incurvated nail margin along the medial border of the hallux right.  She is also demonstrating some mild erythema no edema salines drainage or odor.  Tenderness on palpation is present.  Vascular: Dorsalis Pedis artery and Posterior Tibial artery pedal pulses are 2/4 bilateral with immedate capillary fill time. Pedal hair growth present. No varicosities and no lower extremity edema present bilateral.   Neruologic: Grossly intact via light touch bilateral. Vibratory intact via tuning fork bilateral. Protective threshold with Semmes Wienstein monofilament intact to all pedal sites bilateral. Patellar and Achilles deep tendon reflexes 2+ bilateral. No Babinski or clonus noted bilateral.   Musculoskeletal: No gross boney pedal deformities bilateral. No pain, crepitus, or limitation noted with foot and ankle range of motion bilateral. Muscular strength 5/5 in all groups tested bilateral.  Hallux abductovalgus deformity with pain on palpation of the left first metatarsophalangeal joint overlying the medial dorsal cutaneous nerve primarily.  Gait: Unassisted, Nonantalgic.    Radiographs:  Radiographs demonstrate hallux abductovalgus deformity of the left foot increase in the first intermetatarsal angle increase in the hallux abductus angle no acute findings are noted.  Assessment & Plan:  Assessment: Ingrown toenail tibial border hallux right capsulitis hallux valgus neuritis first metatarsal phalangeal joint left  Plan: Chemical matricectomy was performed tibial border of the hallux right today tolerated procedure well with local anesthetic.  She was given both oral and written home-going instruction for the care and soaking of the toe as well as a prescription for Cortisporin Otic to be applied twice  daily after soaking.  Tolerated procedure well without complications.  I injected a small amount of dexamethasone 2 mg after Betadine skin prep to the first metatarsophalangeal joint.  Tolerated procedure well without complications.     Demoni Gergen T. [place], [place]

## 2022-04-24 NOTE — Patient Instructions (Signed)

## 2022-05-13 ENCOUNTER — Encounter: Payer: Self-pay | Admitting: Podiatry

## 2022-05-13 ENCOUNTER — Ambulatory Visit (INDEPENDENT_AMBULATORY_CARE_PROVIDER_SITE_OTHER): Payer: Medicare HMO | Admitting: Podiatry

## 2022-05-13 DIAGNOSIS — L600 Ingrowing nail: Secondary | ICD-10-CM

## 2022-05-13 DIAGNOSIS — Z9889 Other specified postprocedural states: Secondary | ICD-10-CM

## 2022-05-13 DIAGNOSIS — M778 Other enthesopathies, not elsewhere classified: Secondary | ICD-10-CM

## 2022-05-13 MED ORDER — DEXAMETHASONE SODIUM PHOSPHATE 120 MG/30ML IJ SOLN
2.0000 mg | Freq: Once | INTRAMUSCULAR | Status: AC
  Administered 2022-05-13: 2 mg via INTRA_ARTICULAR

## 2022-05-13 NOTE — Progress Notes (Signed)
She presents today states that the nail is doing just fine as she refers to the right hallux.  She states that the bunion cortisone shot really did not help in the joint.  She states that it still hurts and she wants to know can she get another shot.  Objective: Vital signs are stable she is alert and oriented x 3 hallux right demonstrates well-healed surgical toe.  No erythema edema cellulitis drainage or odor.  First metatarsophalangeal joint left foot demonstrates hallux valgus deformity with bursitis along the medial and dorsal medial aspect of the hypertrophic medial condyle of the head of the first metatarsal.  Assessment: Bursitis first metatarsophalangeal joint left..  Well-healing surgical toe.  Hallux valgus.  Plan: I injected the bursal area today with 2 mg of dexamethasone local anesthetic.  Tolerated procedure well.  I will follow-up with her on an as-needed basis.

## 2022-05-20 ENCOUNTER — Ambulatory Visit
Admission: RE | Admit: 2022-05-20 | Discharge: 2022-05-20 | Disposition: A | Discharge status: Home / Self Care | Payer: Medicare HMO | Source: Ambulatory Visit | Attending: Internal Medicine | Admitting: Internal Medicine

## 2022-05-20 DIAGNOSIS — Z1231 Encounter for screening mammogram for malignant neoplasm of breast: Secondary | ICD-10-CM | POA: Diagnosis not present

## 2023-04-30 ENCOUNTER — Other Ambulatory Visit: Payer: Self-pay | Admitting: Internal Medicine

## 2023-04-30 DIAGNOSIS — Z1231 Encounter for screening mammogram for malignant neoplasm of breast: Secondary | ICD-10-CM

## 2023-05-22 ENCOUNTER — Ambulatory Visit
Admission: RE | Admit: 2023-05-22 | Discharge: 2023-05-22 | Disposition: A | Discharge status: Home / Self Care | Payer: Medicare HMO | Source: Ambulatory Visit | Attending: Internal Medicine | Admitting: Internal Medicine

## 2023-05-22 DIAGNOSIS — Z1231 Encounter for screening mammogram for malignant neoplasm of breast: Secondary | ICD-10-CM | POA: Insufficient documentation

## 2023-07-23 ENCOUNTER — Other Ambulatory Visit: Payer: Self-pay | Admitting: Family Medicine

## 2023-07-23 DIAGNOSIS — M5412 Radiculopathy, cervical region: Secondary | ICD-10-CM

## 2023-08-06 ENCOUNTER — Ambulatory Visit
Admission: RE | Admit: 2023-08-06 | Discharge: 2023-08-06 | Disposition: A | Discharge status: Home / Self Care | Payer: Medicare HMO | Source: Ambulatory Visit | Attending: Family Medicine | Admitting: Family Medicine

## 2023-08-06 DIAGNOSIS — M5412 Radiculopathy, cervical region: Secondary | ICD-10-CM

## 2024-01-05 ENCOUNTER — Other Ambulatory Visit: Payer: Self-pay | Admitting: Internal Medicine

## 2024-01-05 DIAGNOSIS — E7800 Pure hypercholesterolemia, unspecified: Secondary | ICD-10-CM

## 2024-01-05 DIAGNOSIS — R7303 Prediabetes: Secondary | ICD-10-CM

## 2024-01-05 DIAGNOSIS — Z0000 Encounter for general adult medical examination without abnormal findings: Secondary | ICD-10-CM

## 2024-01-22 ENCOUNTER — Other Ambulatory Visit

## 2024-01-29 ENCOUNTER — Ambulatory Visit
Admission: RE | Admit: 2024-01-29 | Discharge: 2024-01-29 | Disposition: A | Discharge status: Home / Self Care | Payer: Self-pay | Source: Ambulatory Visit | Attending: Internal Medicine | Admitting: Internal Medicine

## 2024-01-29 DIAGNOSIS — Z0000 Encounter for general adult medical examination without abnormal findings: Secondary | ICD-10-CM | POA: Insufficient documentation

## 2024-01-29 DIAGNOSIS — R7303 Prediabetes: Secondary | ICD-10-CM | POA: Insufficient documentation

## 2024-01-29 DIAGNOSIS — E7800 Pure hypercholesterolemia, unspecified: Secondary | ICD-10-CM | POA: Insufficient documentation

## 2024-03-15 ENCOUNTER — Other Ambulatory Visit: Payer: Self-pay | Admitting: Internal Medicine

## 2024-03-15 DIAGNOSIS — Z1231 Encounter for screening mammogram for malignant neoplasm of breast: Secondary | ICD-10-CM

## 2024-05-24 ENCOUNTER — Inpatient Hospital Stay: Admission: RE | Admit: 2024-05-24 | Discharge: 2024-05-24 | Attending: Internal Medicine | Admitting: Internal Medicine

## 2024-05-24 DIAGNOSIS — Z1231 Encounter for screening mammogram for malignant neoplasm of breast: Secondary | ICD-10-CM

## 2024-06-01 ENCOUNTER — Other Ambulatory Visit: Payer: Self-pay | Admitting: Internal Medicine

## 2024-06-01 DIAGNOSIS — R928 Other abnormal and inconclusive findings on diagnostic imaging of breast: Secondary | ICD-10-CM

## 2024-06-09 ENCOUNTER — Ambulatory Visit
Admission: RE | Admit: 2024-06-09 | Discharge: 2024-06-09 | Disposition: A | Discharge status: Home / Self Care | Source: Ambulatory Visit | Attending: Internal Medicine | Admitting: Internal Medicine

## 2024-06-09 DIAGNOSIS — R928 Other abnormal and inconclusive findings on diagnostic imaging of breast: Secondary | ICD-10-CM | POA: Insufficient documentation
